# Patient Record
Sex: Male | Born: 1995 | Race: White | Hispanic: No | Marital: Single | Smoking: Never smoker
Health system: Southern US, Community
[De-identification: ages and names within clinical notes are randomized; demographics above are authoritative.]

---
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
## 2017-03-03 ENCOUNTER — Emergency Department (HOSPITAL_BASED_OUTPATIENT_CLINIC_OR_DEPARTMENT_OTHER)
Admission: EM | Admit: 2017-03-03 | Discharge: 2017-03-03 | Disposition: A | Discharge status: Home / Self Care | Payer: 59 | Attending: Emergency Medicine | Admitting: Emergency Medicine

## 2017-03-03 ENCOUNTER — Encounter (HOSPITAL_BASED_OUTPATIENT_CLINIC_OR_DEPARTMENT_OTHER): Payer: Self-pay | Admitting: Emergency Medicine

## 2017-03-03 ENCOUNTER — Emergency Department (HOSPITAL_BASED_OUTPATIENT_CLINIC_OR_DEPARTMENT_OTHER): Payer: 59

## 2017-03-03 DIAGNOSIS — Y9389 Activity, other specified: Secondary | ICD-10-CM | POA: Diagnosis not present

## 2017-03-03 DIAGNOSIS — W2201XA Walked into wall, initial encounter: Secondary | ICD-10-CM | POA: Diagnosis not present

## 2017-03-03 DIAGNOSIS — S60221A Contusion of right hand, initial encounter: Secondary | ICD-10-CM | POA: Diagnosis not present

## 2017-03-03 DIAGNOSIS — S6991XA Unspecified injury of right wrist, hand and finger(s), initial encounter: Secondary | ICD-10-CM | POA: Diagnosis present

## 2017-03-03 DIAGNOSIS — Y999 Unspecified external cause status: Secondary | ICD-10-CM | POA: Diagnosis not present

## 2017-03-03 DIAGNOSIS — Y929 Unspecified place or not applicable: Secondary | ICD-10-CM | POA: Diagnosis not present

## 2017-03-03 NOTE — ED Triage Notes (Signed)
Pt c/o right hand pain after punching wall when he got mad

## 2017-03-04 NOTE — ED Provider Notes (Signed)
[facility]-EMERGENCY DEPT Provider Note   CSN: [id number] Arrival date & time: 03/03/17  1510     History   Chief Complaint Chief Complaint  Patient presents with  . Hand Injury    HPI Jonathon Smith is a 21 y.o. male.  The history is provided by the patient. No language interpreter was used.  Hand Injury   The incident occurred less than 1 hour ago. The incident occurred at home. The pain is present in the right hand. The quality of the pain is described as aching. The pain is moderate. The pain has been constant since the incident. The symptoms are aggravated by movement. He has tried nothing for the symptoms. The treatment provided no relief.  Pt hit a wall   History reviewed. No pertinent past medical history.  There are no active problems to display for this patient.   History reviewed. No pertinent surgical history.     Home Medications    Prior to Admission medications   Not on File    Family History History reviewed. No pertinent family history.  Social History Social History  Substance Use Topics  . Smoking status: Never Smoker  . Smokeless tobacco: Never Used  . Alcohol use Yes     Allergies   Patient has no known allergies.   Review of Systems Review of Systems  All other systems reviewed and are negative.    Physical Exam Updated Vital Signs BP 147/91   Pulse 64   Temp 98.3 F (36.8 C) (Oral)   Resp 18   Ht 5\' 5"  (1.651 m)   Wt 65.8 kg   SpO2 100%   BMI 24.13 kg/m   Physical Exam  Constitutional: He is oriented to person, place, and time. He appears well-developed and well-nourished.  Musculoskeletal: He exhibits tenderness.  Neurological: He is alert and oriented to person, place, and time.  Skin: Skin is warm.  Psychiatric: He has a normal mood and affect.  Nursing note and vitals reviewed.    ED Treatments / Results  Labs (all labs ordered are listed, but only abnormal results are displayed) Labs Reviewed - No data to  display  EKG  EKG Interpretation None       Radiology Dg Hand Complete Right  Result Date: 03/03/2017 CLINICAL DATA:  Punched wall.  Pain to the third MTP joint. EXAM: RIGHT HAND - COMPLETE 3+ VIEW COMPARISON:  None. FINDINGS: Soft tissue swelling is present over the MTP joints. There is no underlying fracture. No radiopaque foreign body is present. IMPRESSION: Soft tissue swelling over the dorsal aspect of the MTP joints without underlying fracture. Electronically Signed   By: Marin Roberts M.D.   On: 03/03/2017 15:32    Procedures Procedures (including critical care time)  Medications Ordered in ED Medications - No data to display   Initial Impression / Assessment and Plan / ED Course  I have reviewed the triage vital signs and the nursing notes.  Pertinent labs & imaging results that were available during my care of the patient were reviewed by me and considered in my medical decision making (see chart for details).       Final Clinical Impressions(s) / ED Diagnoses   Final diagnoses:  Contusion of right hand, initial encounter    New Prescriptions There are no discharge medications for this patient. ace wrap Ice Return if any problems. An After Visit Summary was printed and given to the patient.    Elson Areas, PA-C 03/04/17 1210  Rolan BuccoMelanie Belfi, MD 03/04/17 [phone]

## 2017-04-13 ENCOUNTER — Ambulatory Visit (INDEPENDENT_AMBULATORY_CARE_PROVIDER_SITE_OTHER): Payer: 59 | Admitting: Allergy & Immunology

## 2017-04-13 ENCOUNTER — Encounter: Payer: Self-pay | Admitting: Allergy & Immunology

## 2017-04-13 VITALS — BP 120/76 | HR 80 | Temp 97.9°F | Resp 16 | Ht 64.5 in | Wt 145.0 lb

## 2017-04-13 DIAGNOSIS — J3089 Other allergic rhinitis: Secondary | ICD-10-CM | POA: Diagnosis not present

## 2017-04-13 MED ORDER — AZELASTINE-FLUTICASONE 137-50 MCG/ACT NA SUSP: NASAL | 5 refills | Status: DC

## 2017-04-13 MED ORDER — OLOPATADINE HCL 0.7 % OP SOLN: 1.0000 [drp] | Freq: Every day | OPHTHALMIC | 5 refills | Status: AC | PRN

## 2017-04-13 NOTE — Patient Instructions (Addendum)
1. Perennial allergic rhinitis - Testing today showed: grasses, weeds, trees, dust mites, molds, and cockroach - Start Dysmista two sprays per nostril twice daily. - Add Allegra one tablet daily. - Add Pazeo one drop per eye once daily.  - Allergy shot consent signed. - Check with your insurance company to make sure that you would be Ok with the copays for visits.  2. Return in about 3 months (around 07/13/2017).  Please inform us of any Emergency Department visits, hospitalizations, or changes in symptoms. Call us before going to the ED for breathing or allergy symptoms since we might be able to fit you in for a sick visit. Feel free to contact us anytime with any questions, problems, or concerns.  It was a pleasure to meet you today! Happy spring!   Websites that have reliable patient information: 1. American Academy of Asthma, Allergy, and Immunology: www.aaaai.org 2. Food Allergy Research and Education (FARE): foodallergy.org 3. Mothers of Asthmatics: http://www.asthmacommunitynetwork.org 4. American College of Allergy, Asthma, and Immunology: www.acaai.org  Reducing Pollen Exposure  The American Academy of Allergy, Asthma and Immunology suggests the following steps to reduce your exposure to pollen during allergy seasons.    1. Do not hang sheets or clothing out to dry; pollen may collect on these items. 2. Do not mow lawns or spend time around freshly cut grass; mowing stirs up pollen. 3. Keep windows closed at night.  Keep car windows closed while driving. 4. Minimize morning activities outdoors, a time when pollen counts are usually at their highest. 5. Stay indoors as much as possible when pollen counts or humidity is high and on windy days when pollen tends to remain in the air longer. 6. Use air conditioning when possible.  Many air conditioners have filters that trap the pollen spores. 7. Use a HEPA room air filter to remove pollen form the indoor air you breathe.  Control  of House Dust Mite Allergen    House dust mites play a major role in allergic asthma and rhinitis.  They occur in environments with high humidity wherever human skin, the food for dust mites is found. High levels have been detected in dust obtained from mattresses, pillows, carpets, upholstered furniture, bed covers, clothes and soft toys.  The principal allergen of the house dust mite is found in its feces.  A gram of dust may contain 1,000 mites and 250,000 fecal particles.  Mite antigen is easily measured in the air during house cleaning activities.    1. Encase mattresses, including the box spring, and pillow, in an air tight cover.  Seal the zipper end of the encased mattresses with wide adhesive tape. 2. Wash the bedding in water of 130 degrees Farenheit weekly.  Avoid cotton comforters/quilts and flannel bedding: the most ideal bed covering is the dacron comforter. 3. Remove all upholstered furniture from the bedroom. 4. Remove carpets, carpet padding, rugs, and non-washable window drapes from the bedroom.  Wash drapes weekly or use plastic window coverings. 5. Remove all non-washable stuffed toys from the bedroom.  Wash stuffed toys weekly. 6. Have the room cleaned frequently with a vacuum cleaner and a damp dust-mop.  The patient should not be in a room which is being cleaned and should wait 1 hour after cleaning before going into the room. 7. Close and seal all heating outlets in the bedroom.  Otherwise, the room will become filled with dust-laden air.  An electric heater can be used to heat the room. 8. Reduce indoor humidity  to less than 50%.  Do not use a humidifier.  Control of Cockroach Allergen  Cockroach allergen has been identified as an important cause of acute attacks of asthma, especially in urban settings.  There are fifty-five species of cockroach that exist in the [place], however only three, the [place], [place] species produce allergen that can affect  patients with Asthma.  Allergens can be obtained from fecal particles, egg casings and secretions from cockroaches.    1. Remove food sources. 2. Reduce access to water. 3. Seal access and entry points. 4. Spray runways with 0.5-1% Diazinon or Chlorpyrifos 5. Blow boric acid power under stoves and refrigerator. 6. Place bait stations (hydramethylnon) at feeding sites.

## 2017-04-13 NOTE — Progress Notes (Signed)
NEW PATIENT  Date of Service/Encounter:  04/13/17  Referring provider: No PCP Per Patient   Assessment:   Perennial allergic rhinitis (grasses, weeds, trees, dust mites, molds, cockroach)   Plan/Recommendations:   1. Perennial allergic rhinitis - Testing today showed: grasses, weeds, trees, dust mites, molds, and cockroach - Start Dysmista two sprays per nostril twice daily. - Add Allegra one tablet daily. - Add Pazeo one drop per eye once daily.  - I discussed the risks/benefits of allergy shots and Alexandra is interested in pursuing this. - He does attend [employer] [address] but he could certainly receive the injections there if he is interested.  - Allergy shot consent signed in case he is interested in going forward with this.  - Check with your insurance company to make sure that you would be OK with the copays for visits.  2. Return in about 3 months (around 07/13/2017).   Subjective:   Nadia Torr is a 21 y.o. male presenting today for evaluation of  Chief Complaint  Patient presents with  . Allergies    Congestion, drainage, sneezing  . Conjunctivitis    Kerrigan Glendening has a history of the following: Patient Active Problem List   Diagnosis Date Noted  . Perennial allergic rhinitis 04/13/2017    History obtained from: chart review and patient.  Ernst Spell was referred by No PCP Per Patient.     Layten is a 21 y.o. male presenting for evaluation of allergies.Garan reports that he has had allergy smyptoms for a while, including nasal congestion and rhinorrhea. He has had ocular involvement as well, with pruritis and discharge. He endorses sinus pain and pressure which is certainly worse during the spring and summer season. Over time they have become more severe. He has been using Flonase as well as Claritin and Zyrtec. These do keep the edge off but do not resolve it completely. He has never been allergy tested. He was born in [place] and  moved to [place] when he was [age] year old. Then they moved out here when he entered fourth grade and he has remained in [place] since that time.   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 He does this on the weekends as well as the summer months. Otherwise, he is in college at [employer]. He has a lot of problems when he is working outside. At times he has had to leave work due to his symptoms. His symptoms do not completely disappear during the winter months. Symptoms continue even when he is at college.    Montavis has no history of asthma and has never needed a nebulizer treatment. He has no concerns with food allergies whatsoever. Otherwise, there is no history of other atopic diseases, including drug allergies, stinging insect allergies, or urticaria. There is no significant infectious history. Vaccinations are up to date.    Past Medical History: Patient Active Problem List   Diagnosis Date Noted  . Perennial allergic rhinitis 04/13/2017    Medication List:  Allergies as of 04/13/2017   No Known Allergies     Medication List       Accurate as of 04/13/17 11:09 PM. Always use your most recent med list.          Azelastine-Fluticasone 137-50 MCG/ACT Susp Commonly known as:  DYMISTA Use two sprays in each nostril twice daily as directed   Olopatadine HCl 0.7 % Soln Commonly known as:  PAZEO Place 1 drop into both  eyes daily as needed.       Birth History: non-contributory.   Developmental History: non-contributory.   Past Surgical History: History reviewed. No pertinent surgical history.   Family History: Family History  Problem Relation Age of Onset  . Allergic rhinitis Father   . Diabetes Father   There is a strong family history of allergies in the patient's father as well as his brother. However the allergies of his father are much more severe than his brother. There is no history of asthma or food  allergies.   Social History: Jacobey lives at home with his younger brother, father, and mother. They live in a 10yo home with hardwoods in the main living areas and carpeting in the bedrooms. They have gas heating and central cooling. There are no animals inside of outside of the home. There are no dust mite coverings on the bedding. 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      Review of Systems: a 14-point review of systems is pertinent for what is mentioned in HPI.  Otherwise, all other systems were negative. Constitutional: negative other than that listed in the HPI Eyes: negative other than that listed in the HPI Ears, nose, mouth, throat, and face: negative other than that listed in the HPI Respiratory: negative other than that listed in the HPI Cardiovascular: negative other than that listed in the HPI Gastrointestinal: negative other than that listed in the HPI Genitourinary: negative other than that listed in the HPI Integument: negative other than that listed in the HPI Hematologic: negative other than that listed in the HPI Musculoskeletal: negative other than that listed in the HPI Neurological: negative other than that listed in the HPI Allergy/Immunologic: negative other than that listed in the HPI    Objective:   Blood pressure 120/76, pulse 80, temperature 97.9 F (36.6 C), temperature source Oral, resp. rate 16, height 5' 4.5" (1.638 m), weight 145 lb (65.8 kg). Body mass index is 24.5 kg/m.   Physical Exam:  General: Alert, interactive, in no acute distress. Pleasant. Talkative.  Eyes: marked periorbital edema, Conjunctival injection on the right with limbal sparing, Conjunctival injection on the left with limbal sparing, PERRL bilaterally, No discharge on the right, No discharge on the left, No  Horner-Trantas dots present and allergic shiners present bilaterally Ears: Right TM pearly gray with normal light reflex, Left TM pearly gray with normal light reflex, Right TM intact without perforation and Left TM intact without perforation.  Nose/Throat: External nose within normal limits, nasal crease present and septum midline, turbinates markedly edematous and pale with clear discharge, post-pharynx markedly erythematous with cobblestoning in the posterior oropharynx. Tonsils 2+ without exudates Neck: Supple without thyromegaly.  Adenopathy: no enlarged lymph nodes appreciated in the anterior cervical, occipital, axillary, epitrochlear, inguinal, or popliteal regions Lungs: Clear to auscultation without wheezing, rhonchi or rales. No increased work of breathing. CV: Normal S1/S2, no murmurs. Capillary refill <2 seconds.  Abdomen: Nondistended, nontender. No guarding or rebound tenderness. Bowel sounds present in all fields and hyperactive  Skin: Warm and dry, without lesions or rashes. Extremities:  No clubbing, cyanosis or edema. Neuro:   Grossly intact. No focal deficits appreciated. Responsive to questions.  Diagnostic studies:   Allergy Studies:   Indoor/Outdoor Percutaneous Adult Environmental Panel: positive to bahia grass, [place] grass, johnson grass, [place]  blue grass, meadow fescue grass, perennial rye grass, sweet vernal grass, timothy grass, cocklebur, burweed marsh elder, short ragweed, giant ragweed, English plantain, lamb's quarters, sheep sorrel, rough pigweed, rough marsh elder, common mugwort, ash, birch, American beech, Box elder, red cedar, [address], elm, hickory, maple, oak, pecan pollen, pine, [place] sycamore, black walnut pollen, Df mite, Dp mites, cockroach and tobacco. Otherwise negative with adequate controls.  Indoor/Outdoor Selected Intradermal Environmental Panel: positive to mold mix #2 and mold mix #3. Otherwise negative with adequate  controls.     Malachi Bonds, MD FAAAAI [facility]

## 2017-04-16 ENCOUNTER — Other Ambulatory Visit: Payer: Self-pay

## 2017-04-16 MED ORDER — AZELASTINE HCL 0.1 % NA SOLN: NASAL | 5 refills | Status: AC

## 2017-04-16 MED ORDER — FLUTICASONE PROPIONATE 50 MCG/ACT NA SUSP: NASAL | 5 refills | Status: AC

## 2017-04-17 ENCOUNTER — Telehealth: Payer: Self-pay | Admitting: Allergy & Immunology

## 2017-04-17 NOTE — Telephone Encounter (Signed)
Left message to call me back.

## 2017-04-17 NOTE — Telephone Encounter (Signed)
Please call patient mother Mardene Celeste who is listed on DPR). [phone]. She wants to know what the cost of a 2 vial set will be for [facility] (2 injections) for her son since they have not met their deductible. Please call back

## 2017-04-17 NOTE — Telephone Encounter (Signed)
Mom called back - gave her prices 2 4 vial sets $1993.60 2 injs  $25.54

## 2017-04-24 NOTE — Addendum Note (Signed)
Addended by: Alfonse Spruce on: 04/24/2017 11:28 PM   Modules accepted: Orders

## 2017-04-27 NOTE — Progress Notes (Signed)
Ok, thanks.

## 2017-05-01 DIAGNOSIS — J301 Allergic rhinitis due to pollen: Secondary | ICD-10-CM | POA: Diagnosis not present

## 2017-05-02 DIAGNOSIS — J3089 Other allergic rhinitis: Secondary | ICD-10-CM | POA: Diagnosis not present

## 2017-05-07 ENCOUNTER — Ambulatory Visit (INDEPENDENT_AMBULATORY_CARE_PROVIDER_SITE_OTHER): Payer: 59 | Admitting: *Deleted

## 2017-05-07 DIAGNOSIS — J309 Allergic rhinitis, unspecified: Secondary | ICD-10-CM | POA: Diagnosis not present

## 2017-05-07 MED ORDER — EPINEPHRINE 0.3 MG/0.3ML IJ SOAJ: 0.3000 mg | Freq: Once | INTRAMUSCULAR | 2 refills | Status: AC

## 2017-05-18 ENCOUNTER — Ambulatory Visit (INDEPENDENT_AMBULATORY_CARE_PROVIDER_SITE_OTHER): Payer: 59

## 2017-05-18 DIAGNOSIS — J309 Allergic rhinitis, unspecified: Secondary | ICD-10-CM

## 2017-05-29 ENCOUNTER — Ambulatory Visit (INDEPENDENT_AMBULATORY_CARE_PROVIDER_SITE_OTHER): Payer: 59

## 2017-05-29 DIAGNOSIS — J309 Allergic rhinitis, unspecified: Secondary | ICD-10-CM

## 2017-06-08 ENCOUNTER — Ambulatory Visit (INDEPENDENT_AMBULATORY_CARE_PROVIDER_SITE_OTHER): Payer: 59 | Admitting: *Deleted

## 2017-06-08 DIAGNOSIS — J309 Allergic rhinitis, unspecified: Secondary | ICD-10-CM

## 2017-06-25 ENCOUNTER — Ambulatory Visit (INDEPENDENT_AMBULATORY_CARE_PROVIDER_SITE_OTHER): Payer: 59

## 2017-06-25 DIAGNOSIS — J309 Allergic rhinitis, unspecified: Secondary | ICD-10-CM

## 2018-10-20 IMAGING — CR DG HAND COMPLETE 3+V*R*
3 series · 3 of 3 positions shown · non-contrast
Comparison: None.

CLINICAL DATA: Punched wall.  Pain to the third MTP joint.

EXAM:
RIGHT HAND - COMPLETE 3+ VIEW

[x hand pa right]
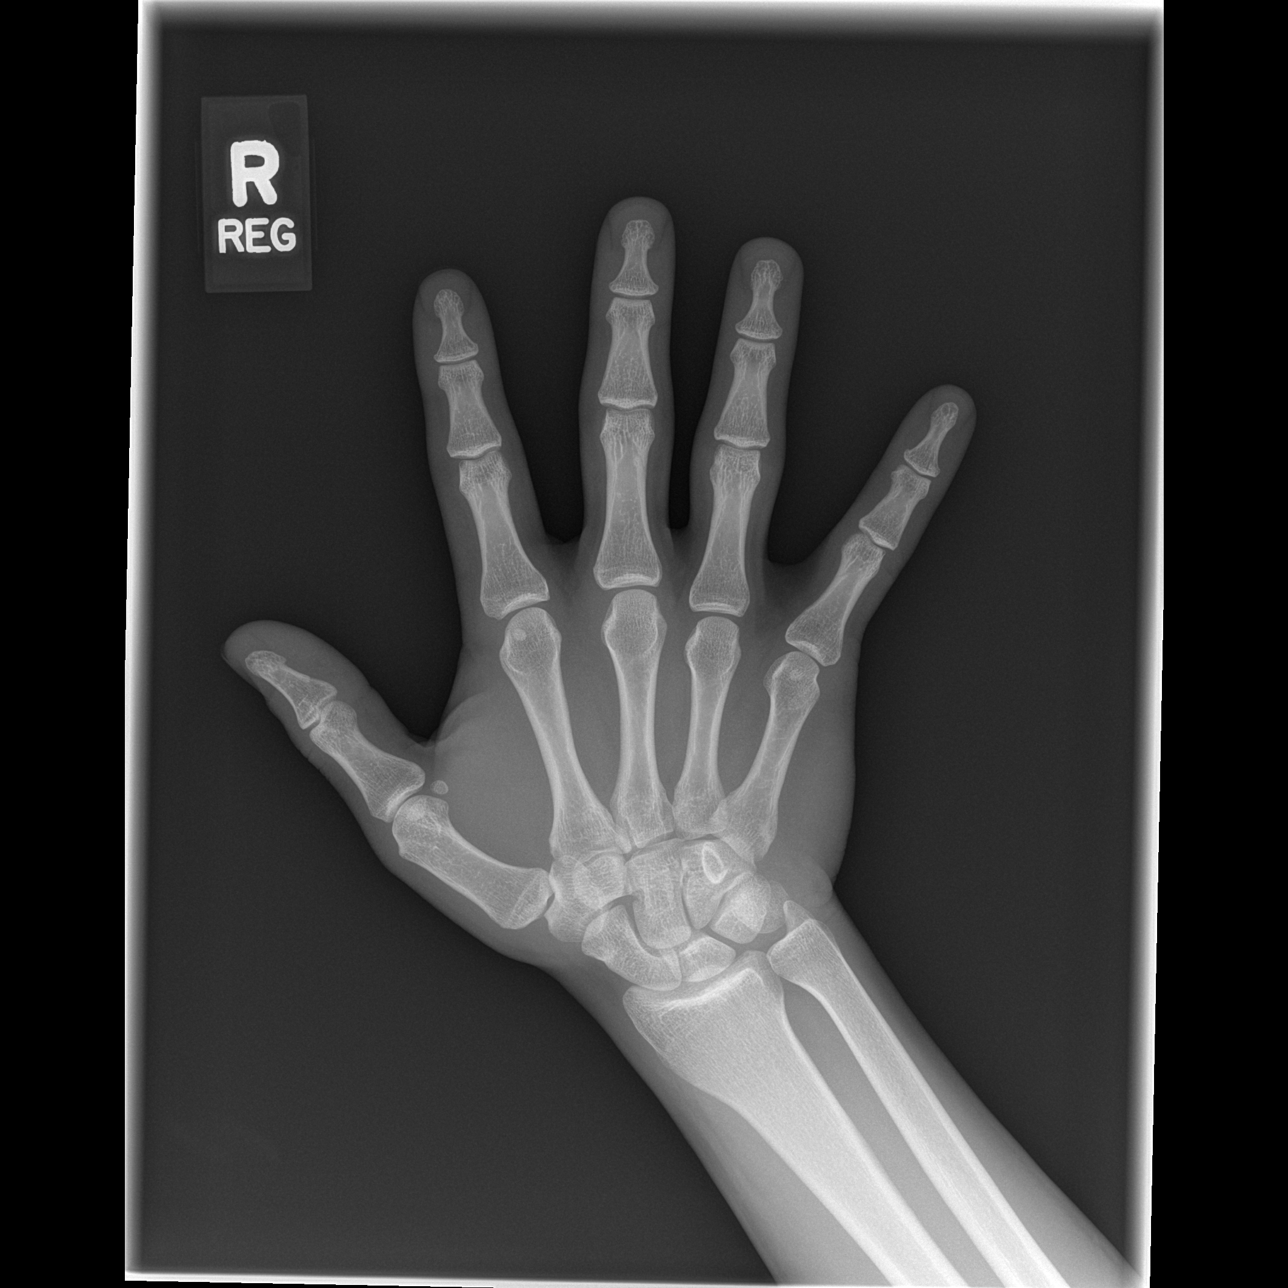

[x hand oblique right]
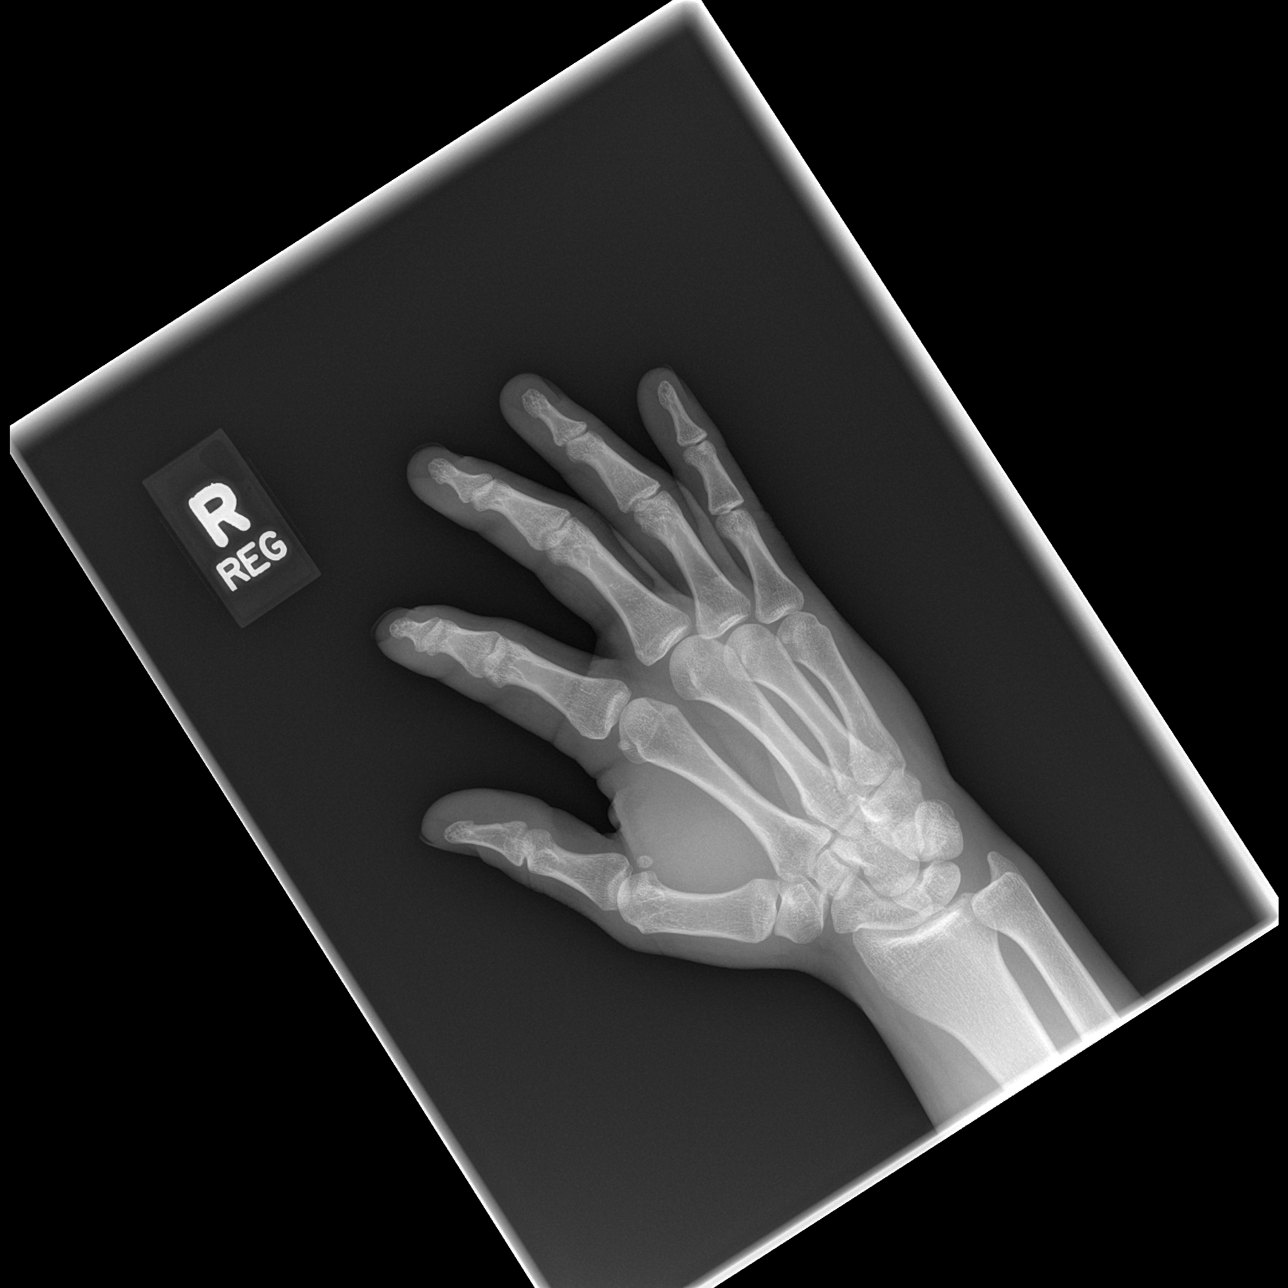

[x hand lat right]
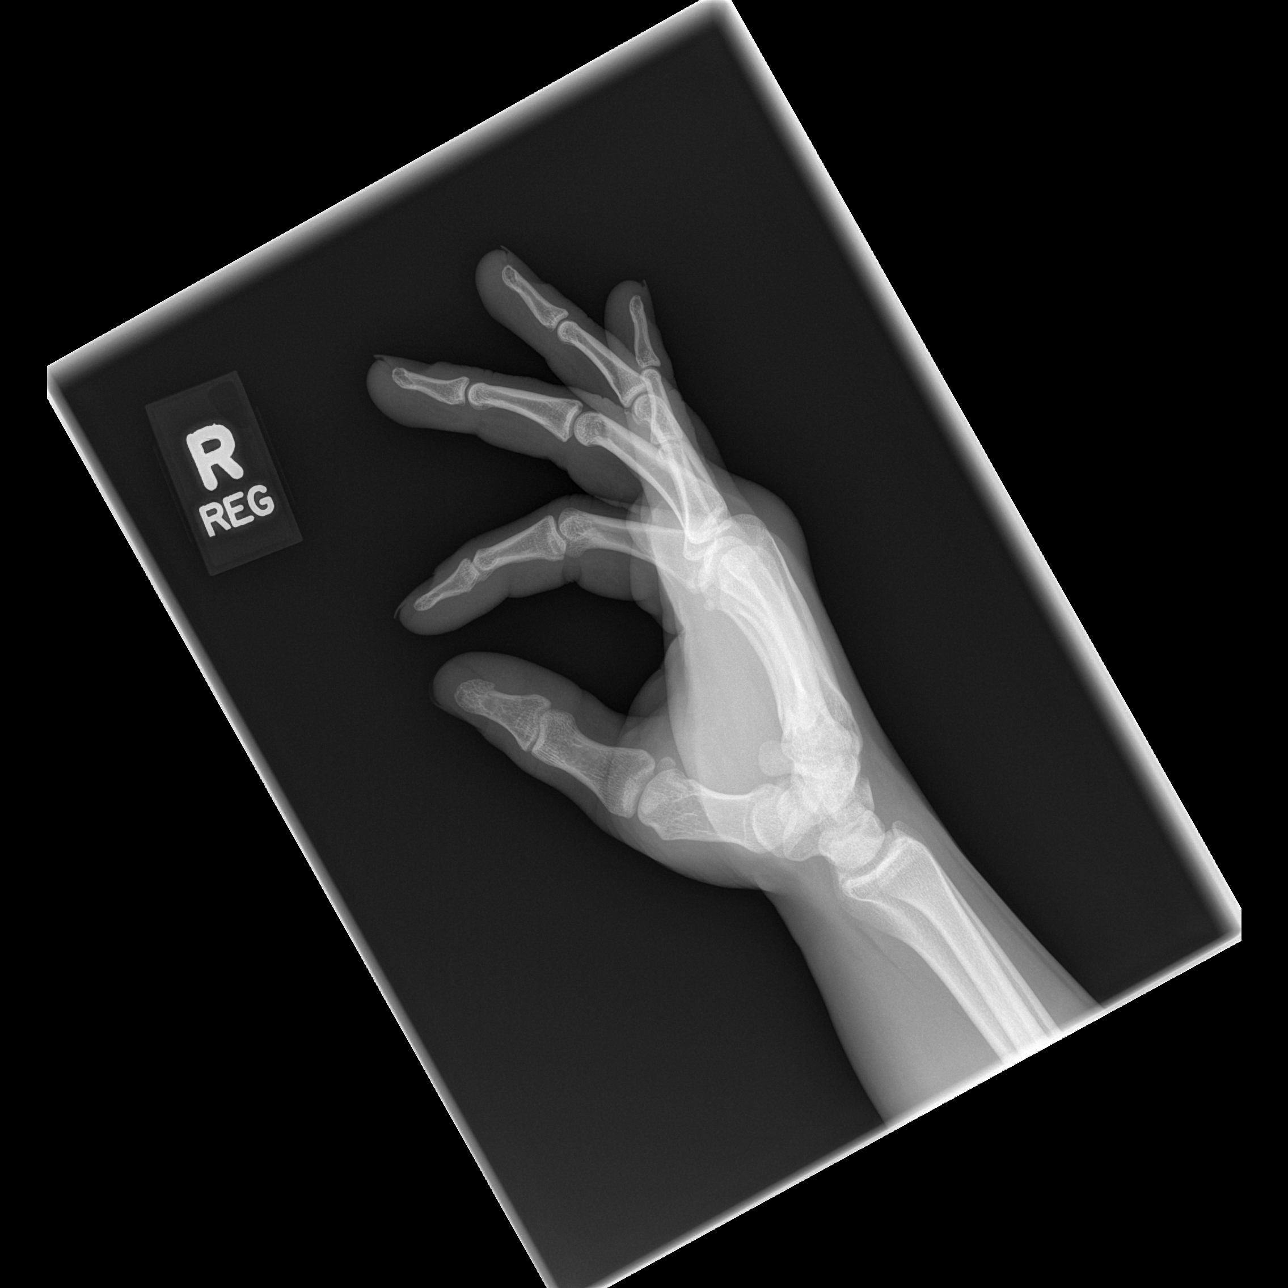

[3 of 3 positions shown; findings below may reference images not displayed]

FINDINGS: Soft tissue swelling is present over the MTP joints. There is no
underlying fracture. No radiopaque foreign body is present.
IMPRESSION: Soft tissue swelling over the dorsal aspect of the MTP joints
without underlying fracture.

## 2020-09-10 ENCOUNTER — Ambulatory Visit: Payer: Self-pay | Attending: Internal Medicine

## 2020-09-10 DIAGNOSIS — Z23 Encounter for immunization: Secondary | ICD-10-CM

## 2020-09-10 NOTE — Progress Notes (Signed)
   Covid-19 Vaccination Clinic  Name:  Jonathon Smith    MRN: [id number] DOB: Mar 02, 1996  09/10/2020  [name] was observed post Covid-19 immunization for 15 minutes without incident. He was provided with Vaccine Information Sheet and instruction to access the V-Safe system. Vaccinated by [facility].   [name] was instructed to call 911 with any severe reactions post vaccine: [name] Kitchen Difficulty breathing  . Swelling of face and throat  . A fast heartbeat  . A bad rash all over body  . Dizziness and weakness   Immunizations Administered    Name Date Dose VIS Date Route   JANSSEN COVID-19 VACCINE 09/10/2020  2:40 PM 0.5 mL 02/21/2020 Intramuscular   Manufacturer: [employer]   Lot: [id number]   NDC: 17494-496-75

## 2022-03-14 ENCOUNTER — Encounter: Payer: Self-pay | Admitting: Family Medicine

## 2022-03-14 ENCOUNTER — Other Ambulatory Visit: Payer: Self-pay

## 2022-03-14 ENCOUNTER — Ambulatory Visit (INDEPENDENT_AMBULATORY_CARE_PROVIDER_SITE_OTHER): Payer: 59 | Admitting: Family Medicine

## 2022-03-14 DIAGNOSIS — R040 Epistaxis: Secondary | ICD-10-CM

## 2022-03-14 NOTE — Assessment & Plan Note (Signed)
Recommend continued use saline spray as needed as well as humidifier at home.  If these continue we can try addition of mupirocin ointment. ?

## 2022-03-14 NOTE — Progress Notes (Signed)
?Jonathon Smith - 26 y.o. male MRN [id number]  Date of birth: 13-Nov-1996 ? ?Subjective ?Chief Complaint  ?Patient presents with  ? Establish Care  ? ? ?HPI ?Jonathon Smith is a 26 year old male here today for initial visit to establish care.  He has been in overall good health.  He does not take any chronic medications. ? 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  Enjoys his work. ? ?He has had some intermittent nosebleeds, this is worse in the fall and winter when the area is drier.  He does use a humidifier. ? ?ROS:  A comprehensive ROS was completed and negative except as noted per HPI ? ?No Known Allergies ? ?History reviewed. No pertinent past medical history. ? ?History reviewed. No pertinent surgical history. ? ?Social History  ? ?Socioeconomic History  ? Marital status: Single  ?  Spouse name: Not on file  ? Number of children: Not on file  ? Years of education: Not on file  ? Highest education level: Not on file  ?Occupational History  ? Not on file  ?Tobacco Use  ? Smoking status: Never  ?  Passive exposure: Never  ? Smokeless tobacco: Never  ?Substance and Sexual Activity  ? Alcohol use: Yes  ? Drug use: Yes  ?  Types: Marijuana  ? Sexual activity: Yes  ?  Partners: Male  ?  Birth control/protection: Condom  ?Other Topics Concern  ? Not on file  ?Social History Narrative  ? Not on file  ? ?Social Determinants of Health  ? ?Financial Resource Strain: Not on file  ?Food Insecurity: Not on file  ?Transportation Needs: Not on file  ?Physical Activity: Not on file  ?Stress: Not on file  ?Social Connections: Not on file  ? ? ?Family History  ?Problem Relation Age of Onset  ? Allergic rhinitis Father   ? Diabetes Father   ? ? ?Health Maintenance  ?Topic Date Due  ? HPV VACCINES (1 - Male 2-dose series) Never done  ? HIV Screening  Never done  ? Hepatitis C Screening  Never done  ? TETANUS/TDAP  Never done  ? COVID-19 Vaccine (2 - Booster for Janssen series) 11/05/2020  ? INFLUENZA VACCINE  Never done   ? ? ? ?----------------------------------------------------------------------------------------------------------------------------------------------------------------------------------------------------------------- ?Physical Exam ?BP 133/78 (BP Location: Right Arm, Patient Position: Sitting, Cuff Size: Normal)   Pulse 63   Temp 98.4 ?F (36.9 ?C) (Oral)   Ht 5' 4.27" (1.632 m)   Wt 135 lb 1.6 oz (61.3 kg)   SpO2 97%   BMI 22.99 kg/m?  ? ?Physical Exam ?Constitutional:   ?   Appearance: Normal appearance.  ?Cardiovascular:  ?   Rate and Rhythm: Normal rate and regular rhythm.  ?Pulmonary:  ?   Effort: Pulmonary effort is normal.  ?   Breath sounds: Normal breath sounds.  ?Musculoskeletal:  ?   Cervical back: Neck supple.  ?Neurological:  ?   Mental Status: He is alert.  ?Psychiatric:     ?   Mood and Affect: Mood normal.     ?   Behavior: Behavior normal.  ? ? ?------------------------------------------------------------------------------------------------------------------------------------------------------------------------------------------------------------------- ?Assessment and Plan ? ?Epistaxis ?Recommend continued use saline spray as needed as well as humidifier at home.  If these continue we can try addition of mupirocin ointment. ? ? ?No orders of the defined types were placed in this encounter. ? ? ?Return in about 4 months (around 07/14/2022) for Annual exam/Blood work. ? ? ? ?This visit occurred during the SARS-CoV-2 public health  emergency.  Safety protocols were in place, including screening questions prior to the visit, additional usage of staff PPE, and extensive cleaning of exam room while observing appropriate contact time as indicated for disinfecting solutions.  ? ?

## 2022-05-03 ENCOUNTER — Other Ambulatory Visit (HOSPITAL_COMMUNITY): Payer: Self-pay

## 2022-05-03 MED ORDER — COVID-19 AT HOME ANTIGEN TEST VI KIT
PACK | 0 refills | Status: AC
  Filled 2022-05-03: qty 4, 4d supply, fill #0

## 2022-07-14 ENCOUNTER — Encounter: Payer: Self-pay | Admitting: Family Medicine

## 2022-07-14 ENCOUNTER — Ambulatory Visit (INDEPENDENT_AMBULATORY_CARE_PROVIDER_SITE_OTHER): Payer: 59 | Admitting: Family Medicine

## 2022-07-14 ENCOUNTER — Other Ambulatory Visit (HOSPITAL_COMMUNITY): Payer: Self-pay

## 2022-07-14 VITALS — BP 124/80 | HR 61 | Temp 97.7°F | Ht 65.0 in | Wt 138.0 lb

## 2022-07-14 DIAGNOSIS — R040 Epistaxis: Secondary | ICD-10-CM | POA: Diagnosis not present

## 2022-07-14 DIAGNOSIS — Z0000 Encounter for general adult medical examination without abnormal findings: Secondary | ICD-10-CM

## 2022-07-14 DIAGNOSIS — Z23 Encounter for immunization: Secondary | ICD-10-CM | POA: Diagnosis not present

## 2022-07-14 DIAGNOSIS — Z13220 Encounter for screening for lipoid disorders: Secondary | ICD-10-CM

## 2022-07-14 LAB — CBC WITH DIFFERENTIAL/PLATELET
Basophils Relative: 0.5 %
Eosinophils Relative: 2 %
HCT: 45.7 % (ref 38.5–50.0)
MCHC: 35 g/dL (ref 32.0–36.0)
Monocytes Relative: 9.7 %
Neutro Abs: 2384 cells/uL (ref 1500–7800)
Neutrophils Relative %: 59.6 %
Platelets: 233 10*3/uL (ref 140–400)
RBC: 4.92 10*6/uL (ref 4.20–5.80)
RDW: 11.7 % (ref 11.0–15.0)
WBC: 4 10*3/uL (ref 3.8–10.8)

## 2022-07-14 MED ORDER — MUPIROCIN 2 % EX OINT
1.0000 | TOPICAL_OINTMENT | Freq: Two times a day (BID) | CUTANEOUS | 0 refills | Status: AC
  Filled 2022-07-14: qty 22, 7d supply, fill #0

## 2022-07-14 NOTE — Assessment & Plan Note (Signed)
Well adult Orders Placed This Encounter  Procedures  . HPV 9-valent vaccine,Recombinat  . Tdap vaccine greater than or equal to 25yo IM  . COMPLETE METABOLIC PANEL WITH GFR  . CBC with Differential  . Lipid Panel w/reflex Direct LDL  Immunizations: Tdap and Gardasil #3 Screenings: Per lab orders Anticipatory guidance/Risk factor reduction:  Recommendations per AVS

## 2022-07-14 NOTE — Patient Instructions (Addendum)
Stop nasal sprays.  Apply mupirocin ointment inside the nose twice daily for 1 week.    Preventive Care 16-25 Years Old, Male Preventive care refers to lifestyle choices and visits with your health care provider that can promote health and wellness. Preventive care visits are also called wellness exams. What can I expect for my preventive care visit? Counseling During your preventive care visit, your health care provider may ask about your: Medical history, including: Past medical problems. Family medical history. Current health, including: Emotional well-being. Home life and relationship well-being. Sexual activity. Lifestyle, including: Alcohol, nicotine or tobacco, and drug use. Access to firearms. Diet, exercise, and sleep habits. Safety issues such as seatbelt and bike helmet use. Sunscreen use. Work and work Astronomer. Physical exam Your health care provider may check your: Height and weight. These may be used to calculate your BMI (body mass index). BMI is a measurement that tells if you are at a healthy weight. Waist circumference. This measures the distance around your waistline. This measurement also tells if you are at a healthy weight and may help predict your risk of certain diseases, such as type 2 diabetes and high blood pressure. Heart rate and blood pressure. Body temperature.  Vaccines are usually given at various ages, according to a schedule. Your health care provider will recommend vaccines for you based on your age, medical history, and lifestyle or other factors, such as travel or where you work. What tests do I need? Screening Your health care provider may recommend screening tests for certain conditions. This may include: Lipid and cholesterol levels. Diabetes screening. This is done by checking your blood sugar (glucose) after you have not eaten for a while (fasting). Hepatitis B test. Hepatitis C test. HIV (human immunodeficiency virus) test. STI  (sexually transmitted infection) testing, if you are at risk. Talk with your health care provider about your test results, treatment options, and if necessary, the need for more tests. Follow these instructions at home: Eating and drinking  Eat a healthy diet that includes fresh fruits and vegetables, whole grains, lean protein, and low-fat dairy products. Drink enough fluid to keep your urine pale yellow. Take vitamin and mineral supplements as recommended by your health care provider. Do not drink alcohol if your health care provider tells you not to drink. If you drink alcohol: Limit how much you have to 0-2 drinks a day. Know how much alcohol is in your drink. In the [place] one drink equals one 12 oz bottle of beer (355 mL), one 5 oz glass of wine (148 mL), or one 1 oz glass of hard liquor (44 mL). Lifestyle Brush your teeth every morning and night with fluoride toothpaste. Floss one time each day. Exercise for at least 30 minutes 5 or more days each week. Do not use any products that contain nicotine or tobacco. These products include cigarettes, chewing tobacco, and vaping devices, such as e-cigarettes. If you need help quitting, ask your health care provider. Do not use drugs. If you are sexually active, practice safe sex. Use a condom or other form of protection to prevent STIs. Find healthy ways to manage stress, such as: Meditation, yoga, or listening to music. Journaling. Talking to a trusted person. Spending time with friends and family. Minimize exposure to UV radiation to reduce your risk of skin cancer. Safety Always wear your seat belt while driving or riding in a vehicle. Do not drive: If you have been drinking alcohol. Do not ride with someone who has been  drinking. If you have been using any mind-altering substances or drugs. While texting. When you are tired or distracted. Wear a helmet and other protective equipment during sports activities. If you have firearms  in your house, make sure you follow all gun safety procedures. Seek help if you have been physically or sexually abused. What's next? Go to your health care provider once a year for an annual wellness visit. Ask your health care provider how often you should have your eyes and teeth checked. Stay up to date on all vaccines. This information is not intended to replace advice given to you by your health care provider. Make sure you discuss any questions you have with your health care provider. Document Revised: 06/08/2021 Document Reviewed: 06/08/2021 Elsevier Patient Education  2023 [employer].

## 2022-07-14 NOTE — Progress Notes (Signed)
Jonathon Smith - 26 y.o. male MRN [id number]  Date of birth: [birth year]  Subjective Chief Complaint  Patient presents with   Annual Exam    HPI Jonathon Smith is 26 y.o. male here today for annual exam.  Reports that overall he is doing well.  He has continued to have some intermittent nose bleeds.  He is using flonase daily.   His job keeps him very active.  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   He vapes occasionally.  He has about 3-4 beers per week.    Immunizations-Due for Tdap and Gardasil #3  Review of Systems  Constitutional:  Negative for chills, fever, malaise/fatigue and weight loss.  HENT:  Negative for congestion, ear pain and sore throat.   Eyes:  Negative for blurred vision, double vision and pain.  Respiratory:  Negative for cough and shortness of breath.   Cardiovascular:  Negative for chest pain and palpitations.  Gastrointestinal:  Negative for abdominal pain, blood in stool, constipation, heartburn and nausea.  Genitourinary:  Negative for dysuria and urgency.  Musculoskeletal:  Negative for joint pain and myalgias.  Neurological:  Negative for dizziness and headaches.  Endo/Heme/Allergies:  Does not bruise/bleed easily.  Psychiatric/Behavioral:  Negative for depression. The patient is not nervous/anxious and does not have insomnia.     Allergies  Allergen Reactions   Grass Pollen([id number] Vern) Itching    No past medical history on file.  No past surgical history on file.  Social History   Socioeconomic History   Marital status: Single    Spouse name: Not on file   Number of children: Not on file   Years of education: Not on file   Highest education level: Not on file  Occupational History   Not on file  Tobacco Use   Smoking status: Never    Passive exposure: Never   Smokeless tobacco: Never  Substance and Sexual Activity   Alcohol use: Yes   Drug use: Yes    Types: Marijuana   Sexual activity: Yes    Partners: Male     Birth control/protection: Condom  Other Topics Concern   Not on file  Social History Narrative   Not on file   Social Determinants of Health   Financial Resource Strain: Not on file  Food Insecurity: Not on file  Transportation Needs: Not on file  Physical Activity: Not on file  Stress: Not on file  Social Connections: Not on file    Family History  Problem Relation Age of Onset   Allergic rhinitis Father    Diabetes Father     Health Maintenance  Topic Date Due   HIV Screening  Never done   Hepatitis C Screening  Never done   COVID-19 Vaccine (2 - Booster for [employer] series) 11/05/2020   INFLUENZA VACCINE  07/25/2022   TETANUS/TDAP  07/14/2032   HPV VACCINES  Completed     ----------------------------------------------------------------------------------------------------------------------------------------------------------------------------------------------------------------- Physical Exam BP 124/80 (BP Location: Left Arm, Patient Position: Sitting, Cuff Size: Normal)   Pulse 61   Temp 97.7 F (36.5 C) (Oral)   Ht 5\' 5"  (1.651 m)   Wt 138 lb 0.6 oz (62.6 kg)   SpO2 97%   BMI 22.97 kg/m   Physical Exam Constitutional:      General: He is not in acute distress. HENT:     Head: Normocephalic and atraumatic.     Right Ear: Tympanic membrane and external ear normal.     Left Ear: Tympanic membrane and external  ear normal.     Nose:     Comments: Ulceration of septum on R  Eyes:     General: No scleral icterus. Neck:     Thyroid: No thyromegaly.  Cardiovascular:     Rate and Rhythm: Normal rate and regular rhythm.     Heart sounds: Normal heart sounds.  Pulmonary:     Effort: Pulmonary effort is normal.     Breath sounds: Normal breath sounds.  Abdominal:     General: Bowel sounds are normal. There is no distension.     Palpations: Abdomen is soft.     Tenderness: There is no abdominal tenderness. There is no guarding.  Musculoskeletal:     Cervical  back: Normal range of motion.  Lymphadenopathy:     Cervical: No cervical adenopathy.  Skin:    General: Skin is warm and dry.     Findings: No rash.  Neurological:     Mental Status: He is alert and oriented to person, place, and time.     Cranial Nerves: No cranial nerve deficit.     Motor: No abnormal muscle tone.  Psychiatric:        Mood and Affect: Mood normal.        Behavior: Behavior normal.     ------------------------------------------------------------------------------------------------------------------------------------------------------------------------------------------------------------------- Assessment and Plan  Epistaxis Stop flonase. Adding mupirocin ointment BID x7 days.   Well adult exam Well adult Orders Placed This Encounter  Procedures   HPV 9-valent vaccine,Recombinat   Tdap vaccine greater than or equal to 7yo IM   COMPLETE METABOLIC PANEL WITH GFR   CBC with Differential   Lipid Panel w/reflex Direct LDL  Immunizations: Tdap and Gardasil #3 Screenings: Per lab orders Anticipatory guidance/Risk factor reduction:  Recommendations per AVS   Meds ordered this encounter  Medications   mupirocin ointment (BACTROBAN) 2 %    Sig: Apply 1 Application topically 2 (two) times daily for 7 days. Apply to inside of nose.    Dispense:  22 g    Refill:  0    No follow-ups on file.    This visit occurred during the SARS-CoV-2 public health emergency.  Safety protocols were in place, including screening questions prior to the visit, additional usage of staff PPE, and extensive cleaning of exam room while observing appropriate contact time as indicated for disinfecting solutions.

## 2022-07-14 NOTE — Assessment & Plan Note (Signed)
Stop flonase. Adding mupirocin ointment BID x7 days.

## 2022-07-15 LAB — COMPLETE METABOLIC PANEL WITH GFR
AG Ratio: 2 (calc) (ref 1.0–2.5)
ALT: 16 U/L (ref 9–46)
AST: 25 U/L (ref 10–40)
Albumin: 4.8 g/dL (ref 3.6–5.1)
Alkaline phosphatase (APISO): 61 U/L (ref 36–130)
BUN: 13 mg/dL (ref 7–25)
CO2: 23 mmol/L (ref 20–32)
Calcium: 10.3 mg/dL (ref 8.6–10.3)
Chloride: 102 mmol/L (ref 98–110)
Creat: 1.02 mg/dL (ref 0.60–1.24)
Globulin: 2.4 g/dL (calc) (ref 1.9–3.7)
Glucose, Bld: 88 mg/dL (ref 65–99)
Potassium: 4.4 mmol/L (ref 3.5–5.3)
Sodium: 138 mmol/L (ref 135–146)
Total Bilirubin: 1.5 mg/dL — ABNORMAL HIGH (ref 0.2–1.2)
Total Protein: 7.2 g/dL (ref 6.1–8.1)
eGFR: 105 mL/min/{1.73_m2} (ref >=60)

## 2022-07-15 LAB — CBC WITH DIFFERENTIAL/PLATELET
Absolute Monocytes: 388 cells/uL (ref 200–950)
Basophils Absolute: 20 cells/uL (ref 0–200)
Eosinophils Absolute: 80 cells/uL (ref 15–500)
Hemoglobin: 16 g/dL (ref 13.2–17.1)
Lymphs Abs: 1128 cells/uL (ref 850–3900)
MCH: 32.5 pg (ref 27.0–33.0)
MCV: 92.9 fL (ref 80.0–100.0)
MPV: 11.2 fL (ref 7.5–12.5)
Total Lymphocyte: 28.2 %

## 2022-07-15 LAB — LIPID PANEL W/REFLEX DIRECT LDL
Cholesterol: 200 mg/dL — ABNORMAL HIGH (ref <200)
HDL: 73 mg/dL (ref >=40)
LDL Cholesterol (Calc): 112 mg/dL (calc) — ABNORMAL HIGH
Non-HDL Cholesterol (Calc): 127 mg/dL (calc) (ref <130)
Total CHOL/HDL Ratio: 2.7 (calc) (ref <5.0)
Triglycerides: 64 mg/dL (ref <150)

## 2022-10-02 ENCOUNTER — Other Ambulatory Visit (HOSPITAL_COMMUNITY): Payer: Self-pay

## 2022-10-02 MED ORDER — OMRON 3 SERIES BP MONITOR DEVI
0 refills | Status: AC
  Filled 2022-10-02: qty 1, 1d supply, fill #0
